# Patient Record
Sex: Male | Born: 1965 | Race: Black or African American | Hispanic: No | Marital: Married
Health system: Southern US, Community
[De-identification: ages and names within clinical notes are randomized; demographics above are authoritative.]

---

## 2003-07-14 ENCOUNTER — Emergency Department (HOSPITAL_COMMUNITY): Admission: AC | Admit: 2003-07-14 | Discharge: 2003-07-14 | Payer: Self-pay | Admitting: Emergency Medicine

## 2004-06-08 IMAGING — CT CT MAXILLOFACIAL W/O CM
1 of 2 series · 15 of 30 positions shown, 19 images · non-contrast
Comparison: none

CLINICAL DATA: Trauma.
 CT FACIAL BONES WITHOUT CONTRAST   (07/14/03 AT 7877 HOURS)
TECHNIQUE: 2.5 mm axial images and 5 mm coronal images were obtained through the facial bones without contrast.

[Series 2: — · axial · 0.33mm/px · z∈[+56,+211]mm · 15 of 70 slices shown, 19 images]
[im 4/70  brain]
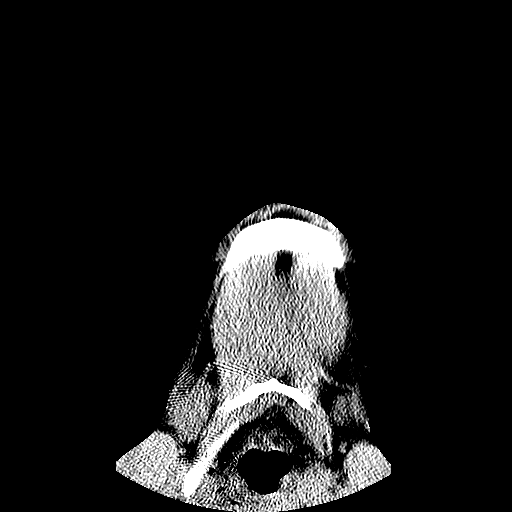
[im 4/70  bone]
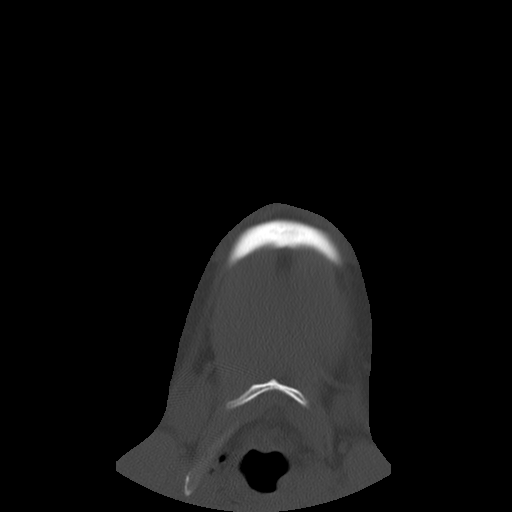
[im 10/70  bone]
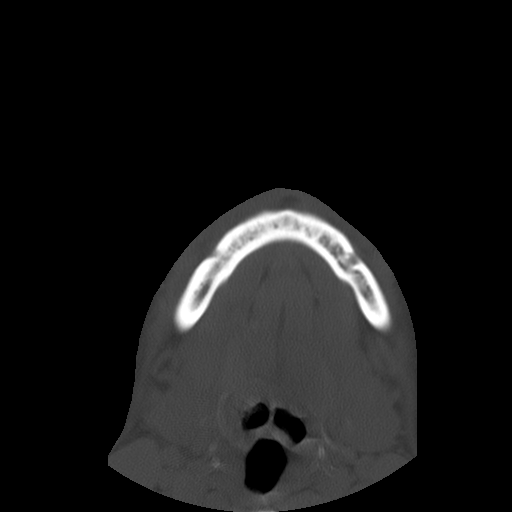
[im 14/70  bone]
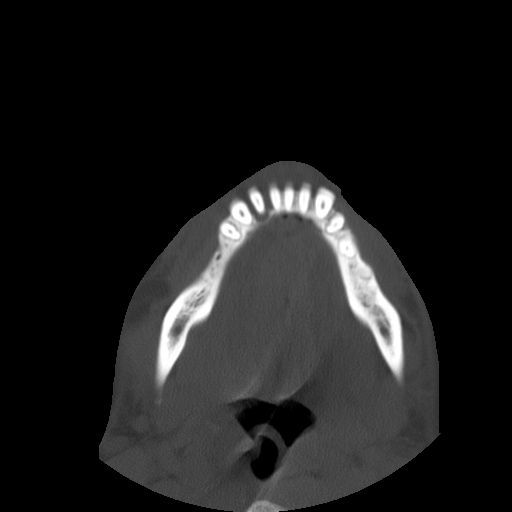
[im 17/70  bone]
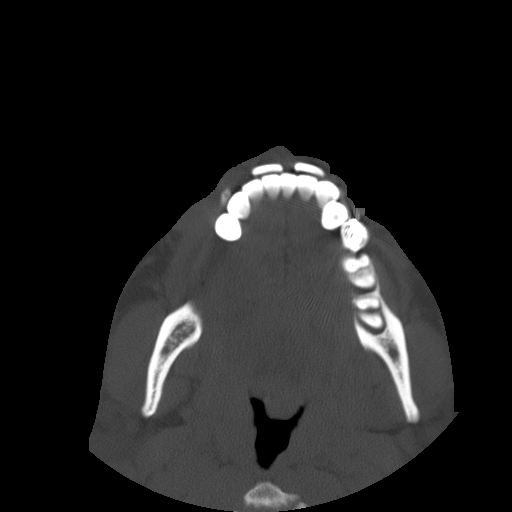
[im 24/70  brain]
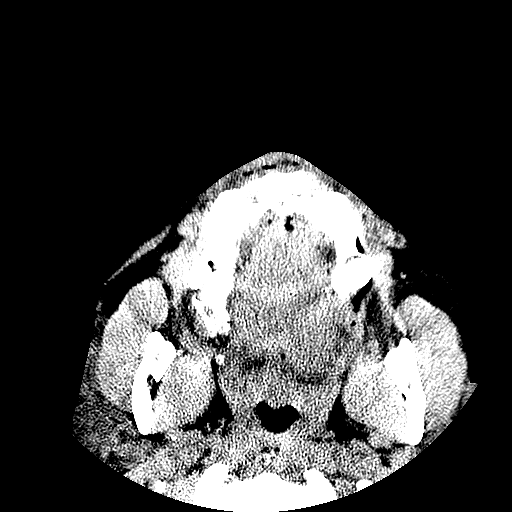
[im 24/70  bone]
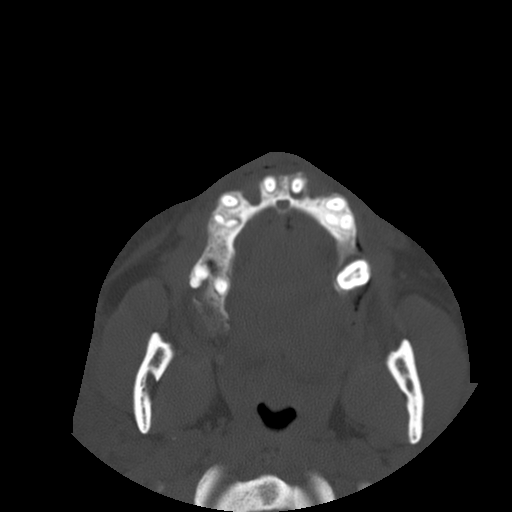
[im 27/70  bone]
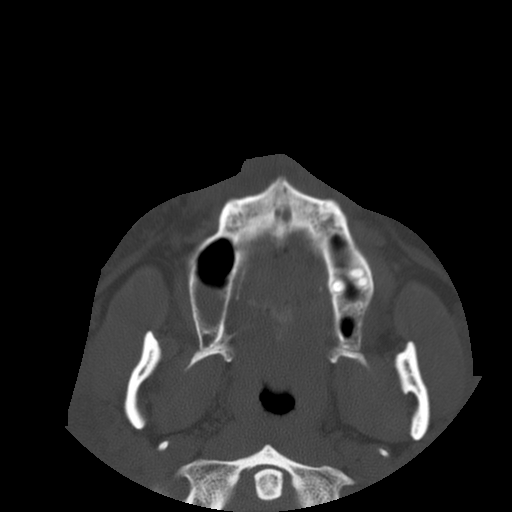
[im 30/70  bone]
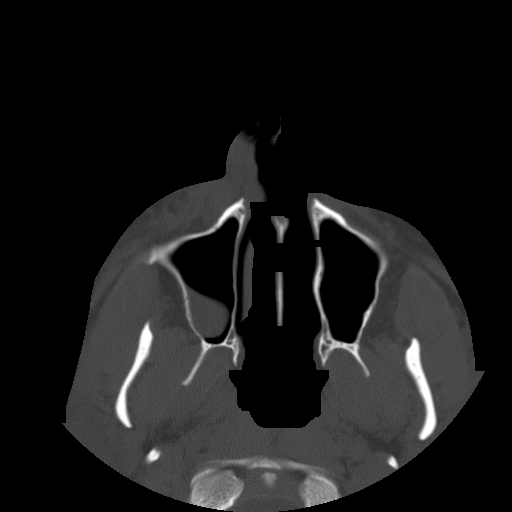
[im 37/70  bone]
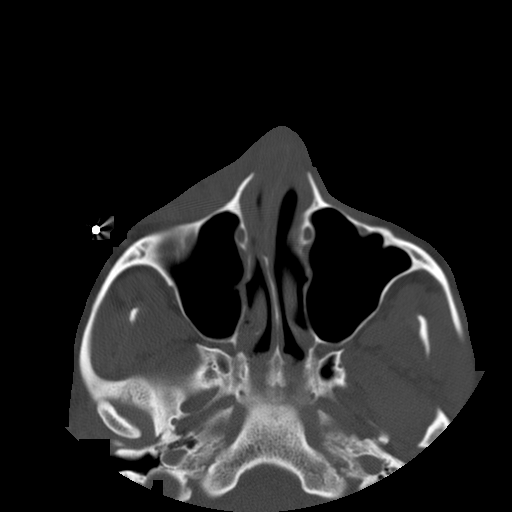
[im 40/70  brain]
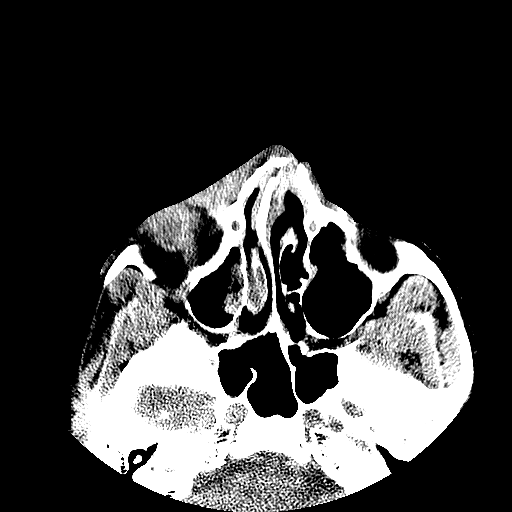
[im 40/70  bone]
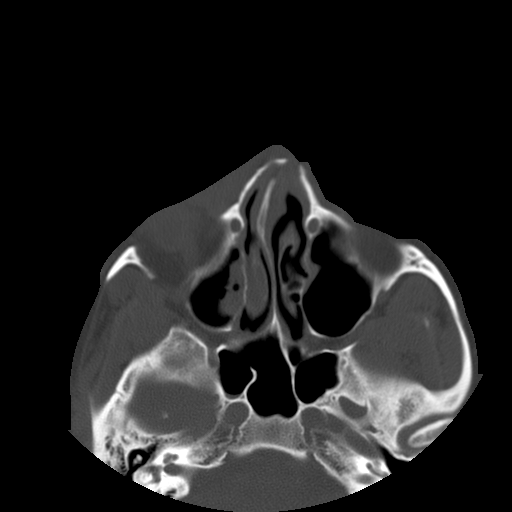
[im 43/70  bone]
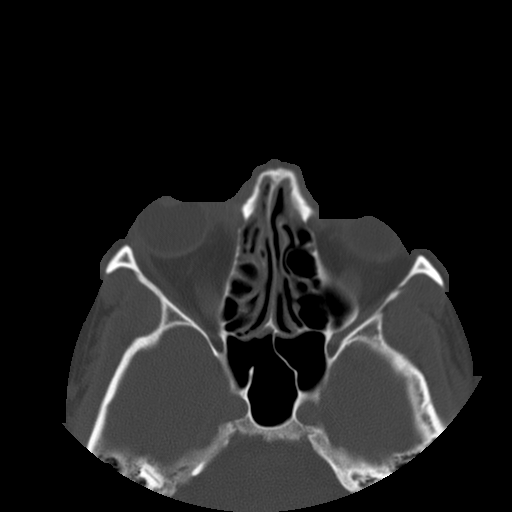
[im 50/70  bone]
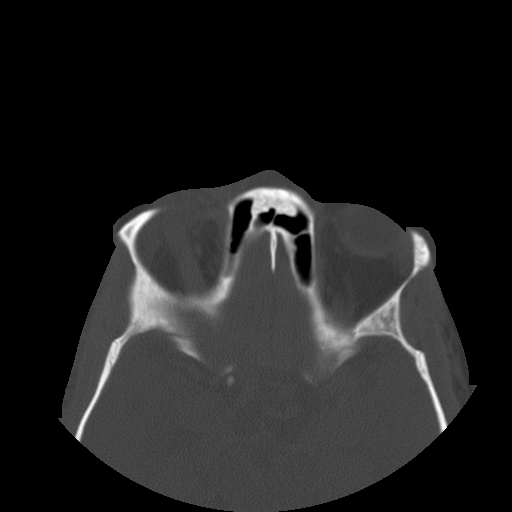
[im 53/70  bone]
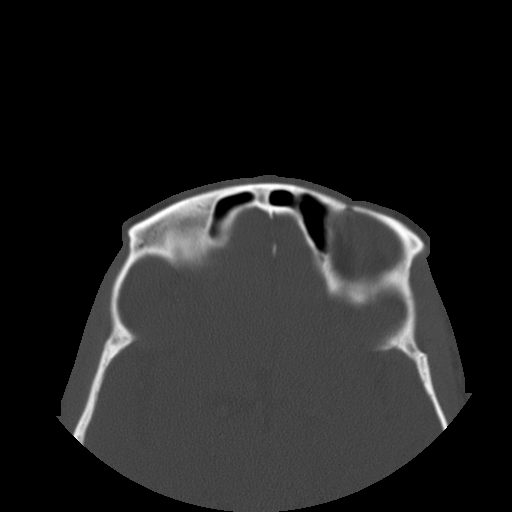
[im 56/70  brain]
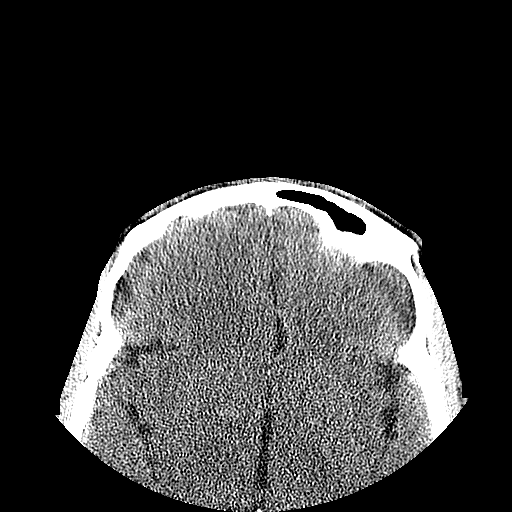
[im 56/70  bone]
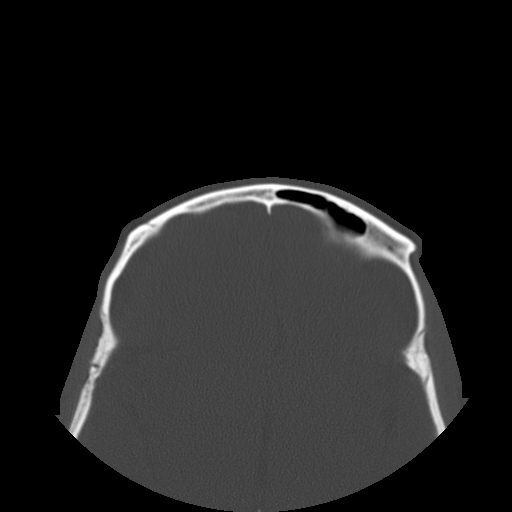
[im 63/70  bone]
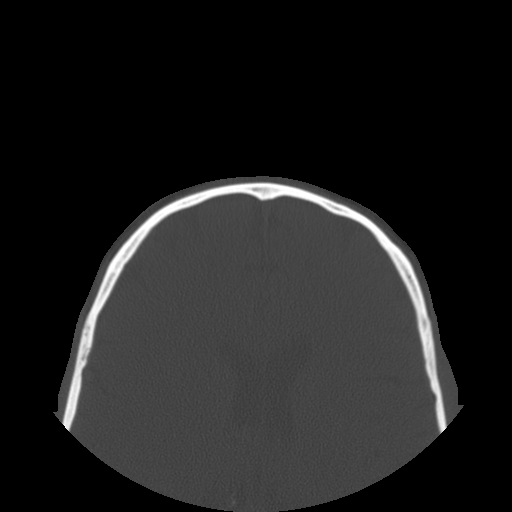
[im 66/70  bone]
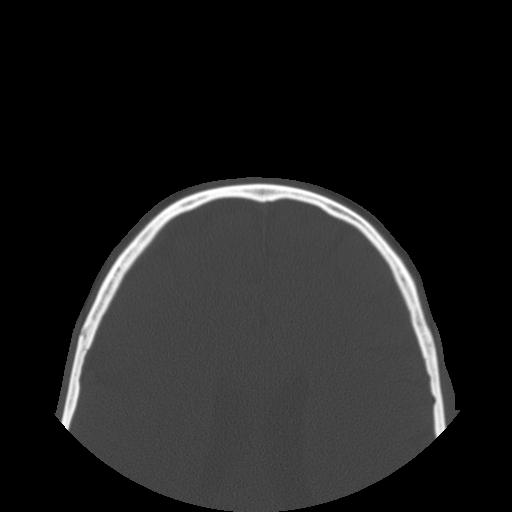

[15 of 30 positions shown; findings below may reference images not displayed]

FINDINGS: There is a displaced medial wall fracture in the right orbit.  The orbital floor is intact.  A small amount of fluid is noted in the right ethmoid air cells.  Fluid, mucous, or a small amount of hemorrhage is noted in the right maxillary sinus.  Soft tissue swelling overlying the skin of the right orbit is noted.  The globe is intact, without obvious exophthalmos.  A nasal bone fracture is noted, which is minimally displaced.  The nasal septum is deviated to the right.  The mandible is intact.  Dental disease is noted, as there is periapical lucency surrounding some of the teeth.  
 There is no evidence of intraocular muscle entrapment associated with the medial orbital wall fracture.
 IMPRESSION
 Nasal bone fracture.
 Right medial orbital wall fracture.
 Periodontal disease.

## 2006-08-13 ENCOUNTER — Emergency Department (HOSPITAL_COMMUNITY): Admission: EM | Admit: 2006-08-13 | Discharge: 2006-08-13 | Payer: Self-pay | Admitting: Family Medicine

## 2013-09-27 ENCOUNTER — Emergency Department (HOSPITAL_COMMUNITY): Payer: Medicaid Other

## 2013-09-27 ENCOUNTER — Emergency Department (HOSPITAL_COMMUNITY)
Admission: EM | Admit: 2013-09-27 | Discharge: 2013-09-27 | Disposition: A | Discharge status: Home / Self Care | Payer: Medicaid Other | Attending: Emergency Medicine | Admitting: Emergency Medicine

## 2013-09-27 DIAGNOSIS — R799 Abnormal finding of blood chemistry, unspecified: Secondary | ICD-10-CM | POA: Insufficient documentation

## 2013-09-27 DIAGNOSIS — R609 Edema, unspecified: Secondary | ICD-10-CM | POA: Insufficient documentation

## 2013-09-27 DIAGNOSIS — R0989 Other specified symptoms and signs involving the circulatory and respiratory systems: Secondary | ICD-10-CM | POA: Insufficient documentation

## 2013-09-27 DIAGNOSIS — I10 Essential (primary) hypertension: Secondary | ICD-10-CM | POA: Insufficient documentation

## 2013-09-27 DIAGNOSIS — R0609 Other forms of dyspnea: Secondary | ICD-10-CM | POA: Insufficient documentation

## 2013-09-27 DIAGNOSIS — R000 Tachycardia, unspecified: Secondary | ICD-10-CM | POA: Diagnosis not present

## 2013-09-27 DIAGNOSIS — R5383 Other fatigue: Secondary | ICD-10-CM

## 2013-09-27 DIAGNOSIS — Z9119 Patient's noncompliance with other medical treatment and regimen: Secondary | ICD-10-CM | POA: Insufficient documentation

## 2013-09-27 DIAGNOSIS — Z91199 Patient's noncompliance with other medical treatment and regimen due to unspecified reason: Secondary | ICD-10-CM | POA: Insufficient documentation

## 2013-09-27 DIAGNOSIS — R5381 Other malaise: Secondary | ICD-10-CM | POA: Diagnosis not present

## 2013-09-27 DIAGNOSIS — M2548 Effusion, other site: Secondary | ICD-10-CM | POA: Diagnosis not present

## 2013-09-27 DIAGNOSIS — R7989 Other specified abnormal findings of blood chemistry: Secondary | ICD-10-CM

## 2013-09-27 LAB — CBC
HCT: 43.8 % (ref 39.0–52.0)
Hemoglobin: 15.9 g/dL (ref 13.0–17.0)
MCH: 29 pg (ref 26.0–34.0)
MCHC: 36.3 g/dL — AB (ref 30.0–36.0)
MCV: 79.8 fL (ref 78.0–100.0)
Platelets: 229 10*3/uL (ref 150–400)
RBC: 5.49 MIL/uL (ref 4.22–5.81)
RDW: 14.4 % (ref 11.5–15.5)
WBC: 7.2 10*3/uL (ref 4.0–10.5)

## 2013-09-27 LAB — PRO B NATRIURETIC PEPTIDE: Pro B Natriuretic peptide (BNP): 15592 pg/mL — ABNORMAL HIGH (ref 0–125)

## 2013-09-27 LAB — I-STAT CHEM 8, ED
BUN: 8 mg/dL (ref 6–23)
Calcium, Ion: 1.09 mmol/L — ABNORMAL LOW (ref 1.12–1.23)
Chloride: 102 mEq/L (ref 96–112)
Creatinine, Ser: 1.4 mg/dL — ABNORMAL HIGH (ref 0.50–1.35)
Glucose, Bld: 97 mg/dL (ref 70–99)
HCT: 50 % (ref 39.0–52.0)
Hemoglobin: 17 g/dL (ref 13.0–17.0)
Potassium: 3 mEq/L — ABNORMAL LOW (ref 3.7–5.3)
Sodium: 138 mEq/L (ref 137–147)
TCO2: 21 mmol/L (ref 0–100)

## 2013-09-27 LAB — I-STAT TROPONIN, ED: TROPONIN I, POC: 0.06 ng/mL (ref 0.00–0.08)

## 2013-09-27 MED ORDER — FUROSEMIDE 20 MG PO TABS: 20.0000 mg | ORAL_TABLET | Freq: Every day | ORAL | Status: AC

## 2013-09-27 MED ORDER — METOPROLOL SUCCINATE ER 25 MG PO TB24: 25.0000 mg | ORAL_TABLET | Freq: Every day | ORAL | Status: AC

## 2013-09-27 MED ORDER — LABETALOL HCL 5 MG/ML IV SOLN
20.0000 mg | Freq: Once | INTRAVENOUS | Status: AC
  Administered 2013-09-27: 20 mg via INTRAVENOUS
  Filled 2013-09-27: qty 4

## 2013-09-27 NOTE — ED Notes (Signed)
Pt states uncontrolled hypertension and swelling to bilateral lower extremities. Ongoing for several days. Pedal pulses present bilaterally. 2+ pitting edema noted to L lower leg. Denies chest pain. Also states SOB. Respirations unlabored.

## 2013-09-27 NOTE — ED Notes (Signed)
MD Jeraldine LootsLockwood at bedside to update patient

## 2013-09-27 NOTE — ED Notes (Signed)
MD aware of patient VS at discharge

## 2013-09-27 NOTE — ED Provider Notes (Signed)
CSN: [id number]     Arrival date & time 09/27/13  1934 History   First MD Initiated Contact with Patient 09/27/13 2006     Chief Complaint  Patient presents with  . Joint Swelling  . Hypertension     (Consider location/radiation/quality/duration/timing/severity/associated sxs/prior Treatment) HPI Patient presents with concerns bilateral lower extremity edema, fatigue, dyspnea. Symptoms began approximately 2 weeks ago without clear precipitant. Since onset symptoms have been progressive. No chest pain, belly pain, lightheadedness, syncope. Patient is have a distant history of hypertension for which he is not currently taking any of his medication. He denies current fevers, chills, vomiting, diarrhea, confusion, disorientation.     No past medical history on file. No past surgical history on file. No family history on file. History  Substance Use Topics  . Smoking status: Not on file  . Smokeless tobacco: Not on file  . Alcohol Use: Not on file    Review of Systems  Constitutional:       Per HPI, otherwise negative  HENT:       Per HPI, otherwise negative  Respiratory:       Per HPI, otherwise negative  Cardiovascular:       Per HPI, otherwise negative  Gastrointestinal: Negative for vomiting.  Endocrine:       Negative aside from HPI  Genitourinary:       Neg aside from HPI   Musculoskeletal:       Per HPI, otherwise negative  Skin: Negative.   Neurological: Negative for syncope.      Allergies  Review of patient's allergies indicates not on file.  Home Medications   Current Outpatient Rx  Name  Route  Sig  Dispense  Refill  . DM-Doxylamine-Acetaminophen (NYQUIL COLD & FLU PO)   Oral   Take 30 mLs by mouth every 6 (six) hours as needed (for cold).          BP 203/160  Pulse 109  Temp(Src) 98.7 F (37.1 C) (Oral)  Resp 20  Ht 5' 5.5" (1.664 m)  Wt 180 lb (81.647 kg)  BMI 29.49 kg/m2  SpO2 100% Physical Exam  Nursing note and vitals  reviewed. Constitutional: He is oriented to person, place, and time. He appears well-developed. No distress.  HENT:  Head: Normocephalic and atraumatic.  Eyes: Conjunctivae and EOM are normal.  Cardiovascular: Regular rhythm.  Tachycardia present.   Pulmonary/Chest: Effort normal. No stridor. No respiratory distress.  Abdominal: He exhibits no distension.  Musculoskeletal: He exhibits edema.  Edema about both distal lower extremities, left greater than right, pitting plus 1  Neurological: He is alert and oriented to person, place, and time.  Skin: Skin is warm and dry.  Psychiatric: He has a normal mood and affect.    ED Course  Procedures (including critical care time) Labs Review Labs Reviewed  CBC - Abnormal; Notable for the following:    MCHC 36.3 (*)    All other components within normal limits  PRO B NATRIURETIC PEPTIDE  I-STAT CHEM 8, ED   Imaging Review Dg Ankle Complete Left  09/27/2013   CLINICAL DATA:  Joint swelling, ankle pain.  No known injury.  EXAM: LEFT ANKLE COMPLETE - 3+ VIEW  COMPARISON:  None.  FINDINGS: Diffuse soft tissue swelling. No acute bony abnormality. No fracture, subluxation or dislocation. Joint space is maintained.  IMPRESSION: No acute bony abnormality.  Diffuse soft tissue swelling.   Electronically Signed   By: Charlett NoseKevin  Dover M.D.   On: 09/27/2013 20:13  EKG Interpretation   Date/Time:  Wednesday September 27 2013 20:16:40 EDT Ventricular Rate:  109 PR Interval:  128 QRS Duration: 89 QT Interval:  347 QTC Calculation: 467 R Axis:   67 Text Interpretation:  Sinus tachycardia Biatrial enlargement LVH with  secondary repolarization abnormality Anterior ST elevation, probably due  to LVH Sinus tachycardia Left ventricular hypertrophy T wave abnormality  Abnormal ekg Confirmed by Gerhard Munch  MD ([id number]) on 09/27/2013 8:45:20  PM      On repeat exam patient's blood pressure substantially better, 20% less. He has no new complaints. He now  shows me an area of concern on his left distal second digit.  He notes a chronic nodule formation, with no loss of function, or pain.   MDM   Final diagnoses:  Hypertension  Elevated brain natriuretic peptide (BNP) level    Patient presents with concerns bilateral lower extremity edema, dyspnea.  Patient has a history of hypertension, but has been noncompliant with medication for a long time.  Recent evaluation demonstrates both elevated BNP and mild elevation in creatinine patient improved here substantially.  Patient was started on a medication regimen, will followup next week with cardiology. Absent distress, chest pain, lightheadedness, syncope, ischemic changes on EKG, patient did not require inpatient evaluation, but will require close followup.    Gerhard Munch, MD 09/27/13 [id number]

## 2013-09-27 NOTE — ED Notes (Signed)
Pt transported to xray 

## 2013-09-27 NOTE — Discharge Instructions (Signed)
As discussed, with your elevated blood pressure, and swelling, there is some suspicion for ongoing hypertension with the improvement here, you have been discharged to follow up with cardiology next week.  Return here for any concerning changes in your condition.  On a secondary level, please follow up with our hand surgeon for further evaluation of the nodules on your fingers

## 2013-09-27 NOTE — ED Notes (Signed)
Left ankle swollen.  A year ago, pt. Injured it.  Now, woke up x 2 weeks ago. And swollen since. Never cam to hospital to have ankle evaluated.

## 2014-08-23 IMAGING — CR DG ANKLE COMPLETE 3+V*L*
3 series · 3 of 3 positions shown · non-contrast
Comparison: None.

CLINICAL DATA: Joint swelling, ankle pain.  No known injury.

EXAM:
LEFT ANKLE COMPLETE - 3+ VIEW

[x ankle ap left]
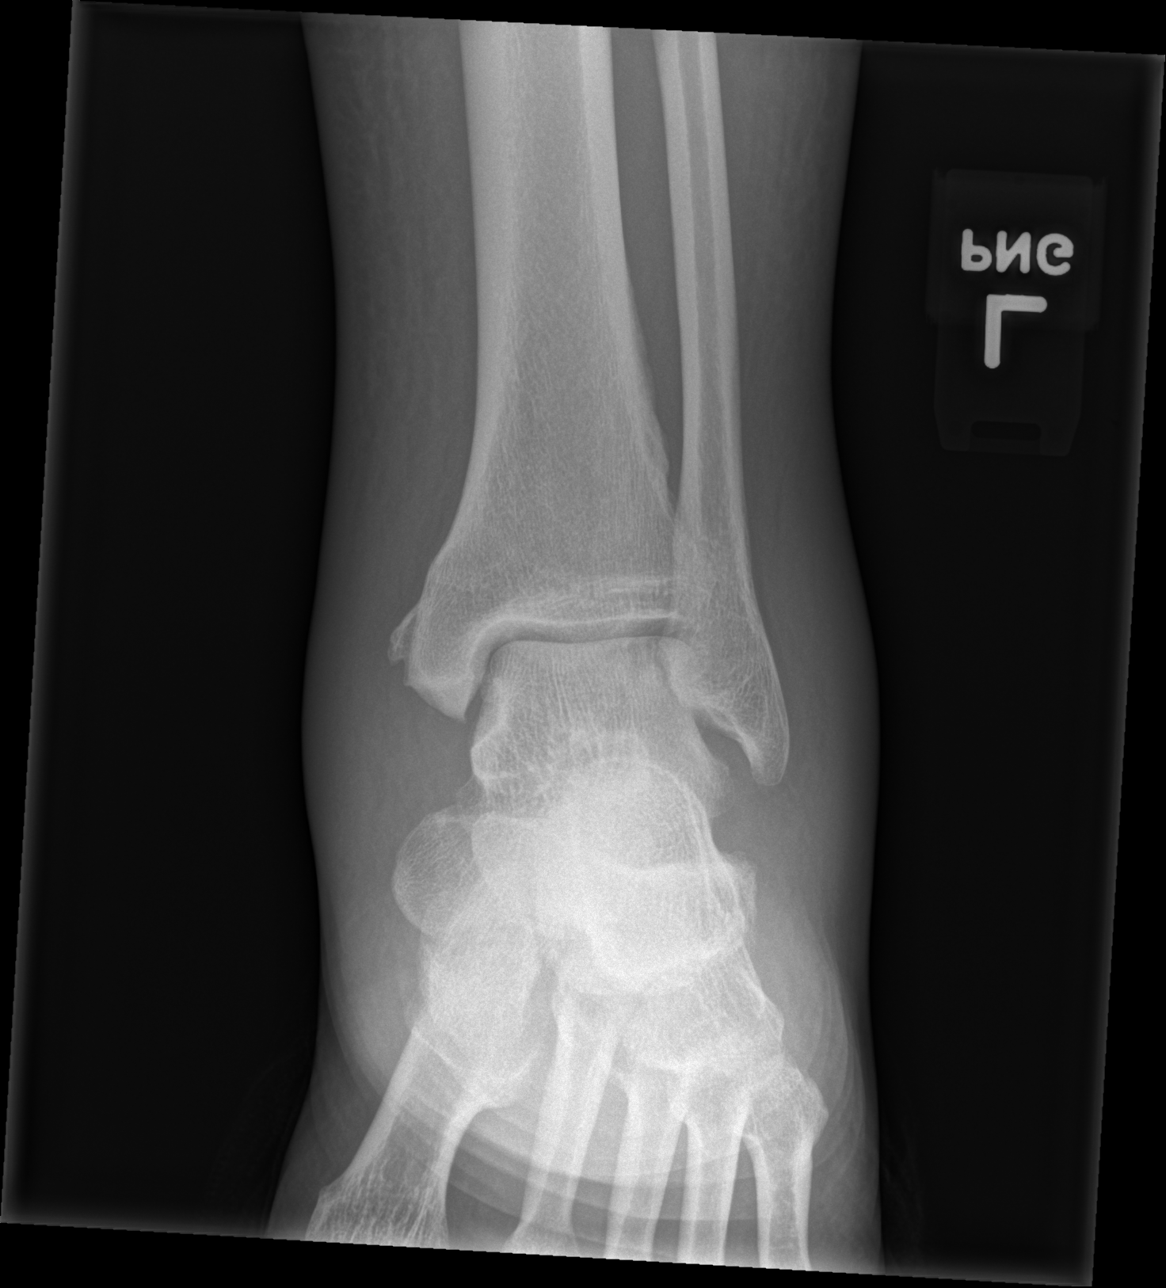

[x ankle obl left]
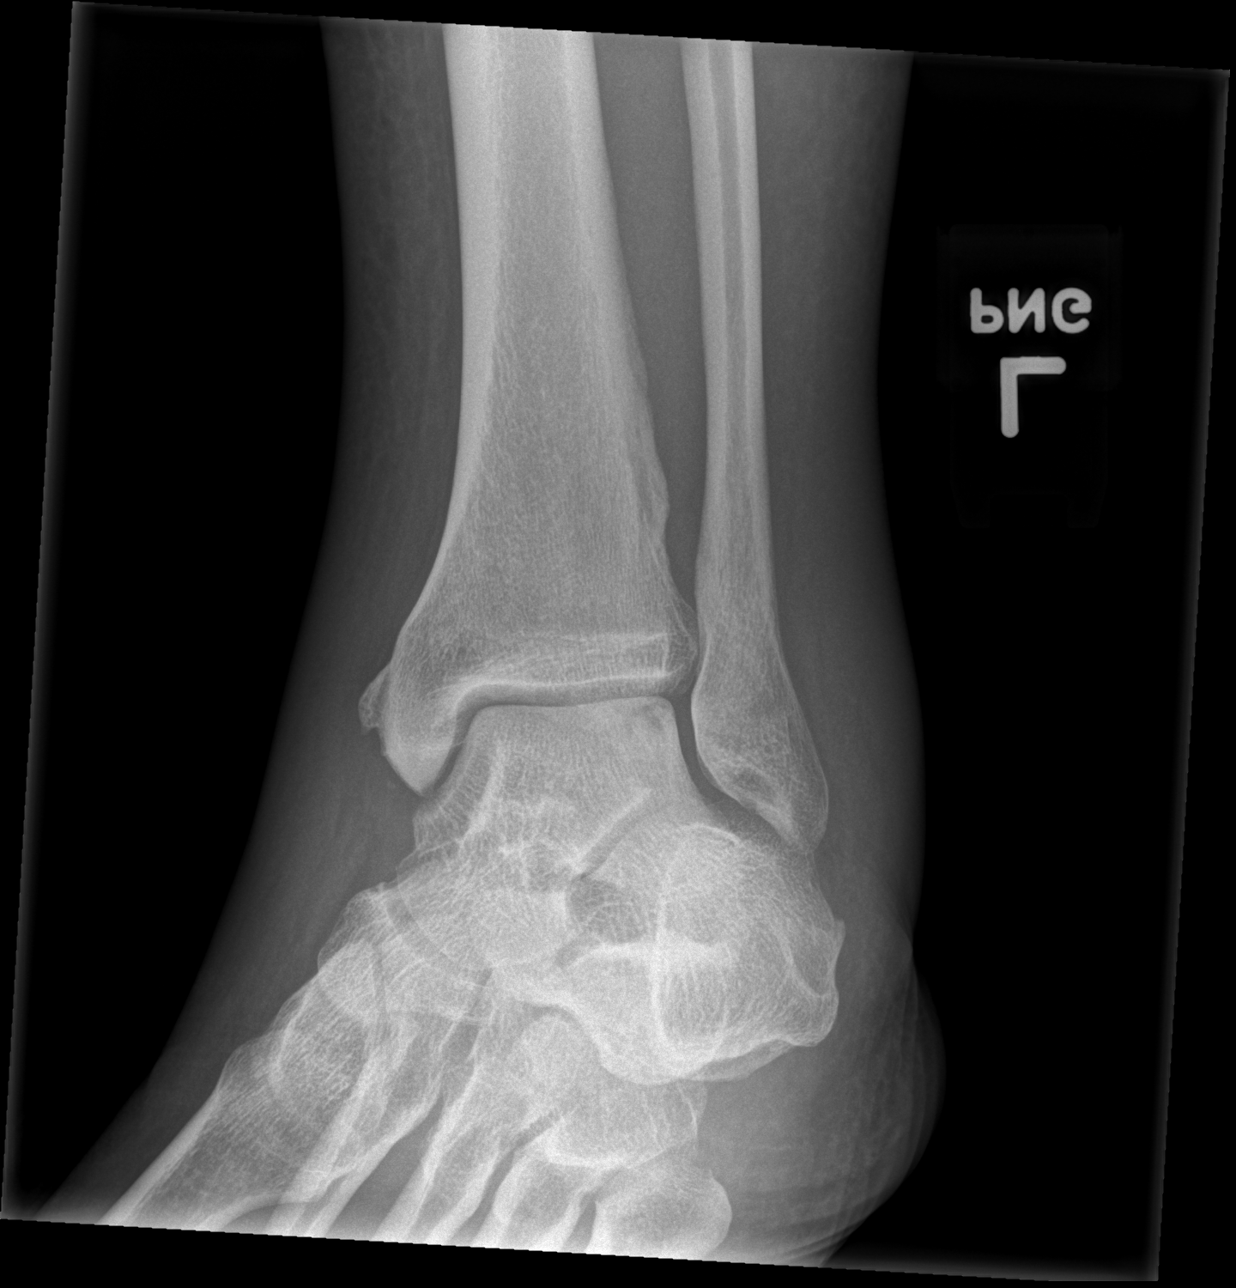

[x ankle lat left]
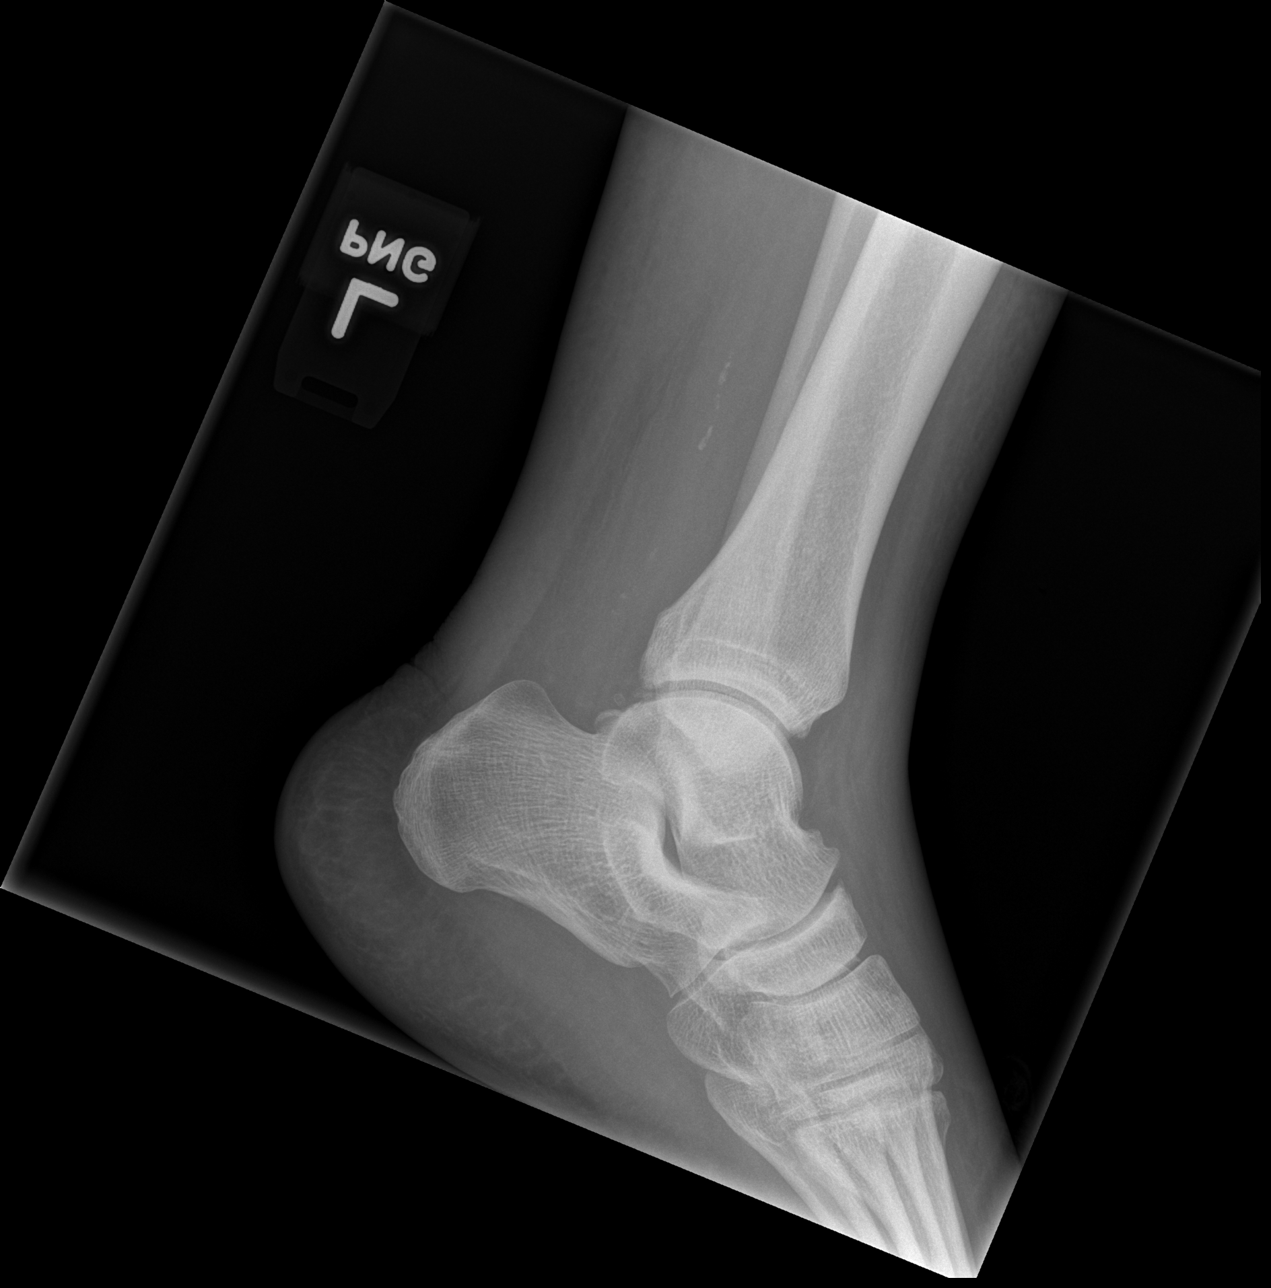

[3 of 3 positions shown; findings below may reference images not displayed]

FINDINGS: Diffuse soft tissue swelling. No acute bony abnormality. No
fracture, subluxation or dislocation. Joint space is maintained.
IMPRESSION: No acute bony abnormality.  Diffuse soft tissue swelling.
